# Patient Record
Sex: Female | Born: 1965 | Hispanic: No | Marital: Married | Smoking: Never smoker
Health system: Southern US, Community
[De-identification: ages and names within clinical notes are randomized; demographics above are authoritative.]

---

## 2009-08-07 DEATH — deceased

## 2010-08-07 HISTORY — PX: BREAST BIOPSY: SHX20

## 2010-11-04 ENCOUNTER — Ambulatory Visit: Payer: Self-pay | Admitting: Family Medicine

## 2011-07-12 ENCOUNTER — Ambulatory Visit: Payer: Self-pay

## 2011-07-13 ENCOUNTER — Ambulatory Visit: Payer: Self-pay

## 2012-01-25 ENCOUNTER — Ambulatory Visit: Payer: Self-pay | Admitting: General Surgery

## 2012-04-27 ENCOUNTER — Ambulatory Visit: Payer: Self-pay | Admitting: Physician Assistant

## 2012-04-27 LAB — URINALYSIS, COMPLETE
Ketone: NEGATIVE
Nitrite: NEGATIVE
Ph: 6 (ref 4.5–8.0)
Protein: 300
Specific Gravity: >=1.03 (ref 1.003–1.030)

## 2012-05-03 ENCOUNTER — Emergency Department: Payer: Self-pay | Admitting: Emergency Medicine

## 2012-05-03 LAB — CBC
HCT: 35.2 % (ref 35.0–47.0)
HGB: 11.5 g/dL — ABNORMAL LOW (ref 12.0–16.0)
MCV: 88 fL (ref 80–100)
RBC: 3.99 10*6/uL (ref 3.80–5.20)
RDW: 14.6 % — ABNORMAL HIGH (ref 11.5–14.5)
WBC: 14 10*3/uL — ABNORMAL HIGH (ref 3.6–11.0)

## 2012-05-03 LAB — COMPREHENSIVE METABOLIC PANEL
Alkaline Phosphatase: 326 U/L — ABNORMAL HIGH (ref 50–136)
Calcium, Total: 8.9 mg/dL (ref 8.5–10.1)
Co2: 26 mmol/L (ref 21–32)
Creatinine: 1.11 mg/dL (ref 0.60–1.30)
EGFR (Non-African Amer.): 59 — ABNORMAL LOW
Glucose: 114 mg/dL — ABNORMAL HIGH (ref 65–99)
SGPT (ALT): 33 U/L (ref 12–78)

## 2012-05-03 LAB — URINALYSIS, COMPLETE
Glucose,UR: NEGATIVE mg/dL (ref 0–75)
Ketone: NEGATIVE
Nitrite: NEGATIVE
Ph: 6 (ref 4.5–8.0)
Protein: NEGATIVE
Specific Gravity: 1.008 (ref 1.003–1.030)
WBC UR: 8 /HPF (ref 0–5)

## 2013-01-15 ENCOUNTER — Ambulatory Visit: Payer: Self-pay

## 2014-08-09 IMAGING — CT CT STONE STUDY
1 of 2 series · 15 of 32 positions shown, 19 images · non-contrast
Comparison: None

REASON FOR EXAM: left flank pain
COMMENTS:

PROCEDURE:     CT  - CT ABDOMEN /PELVIS WO (STONE)  - May 03, 2012  [DATE]
RESULT:     Indication: Flank Pain
TECHNIQUE: Multiple axial images from the lung bases to the symphysis pubis
were obtained without oral and without intravenous contrast.

[Series 2: 3mm soft tissue · axial · 0.78mm/px · z∈[-488,-59]mm · 15 of 157 slices shown, 19 images]
[im 7/157  soft-tissue]
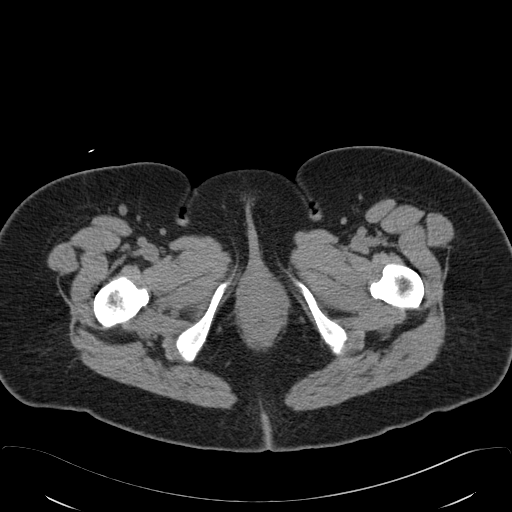
[im 7/157  bone]
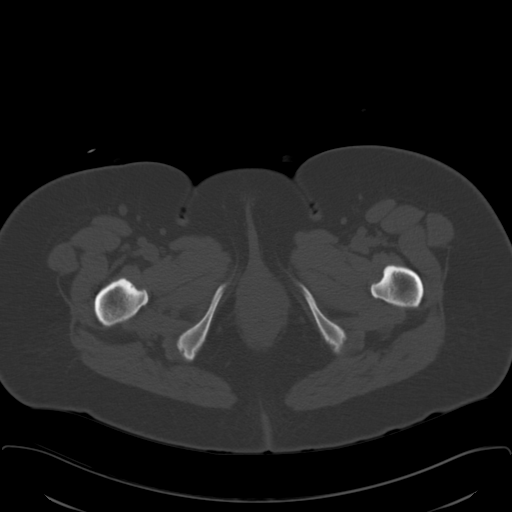
[im 21/157  soft-tissue]
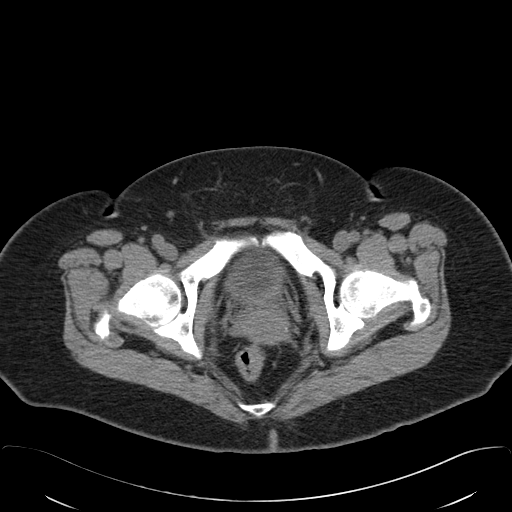
[im 34/157  soft-tissue]
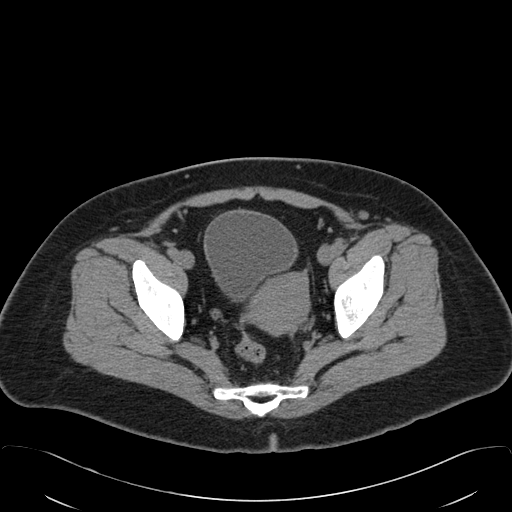
[im 41/157  soft-tissue]
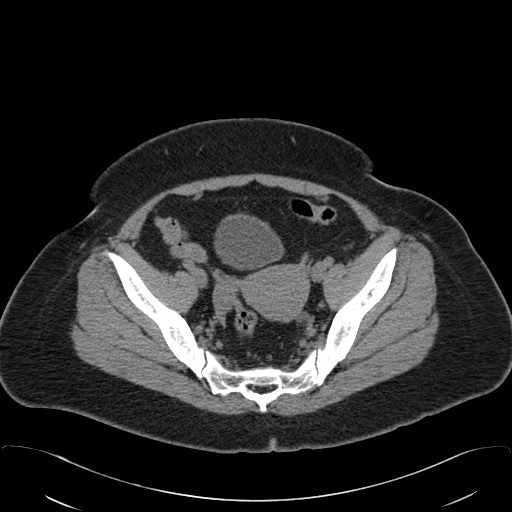
[im 55/157  soft-tissue]
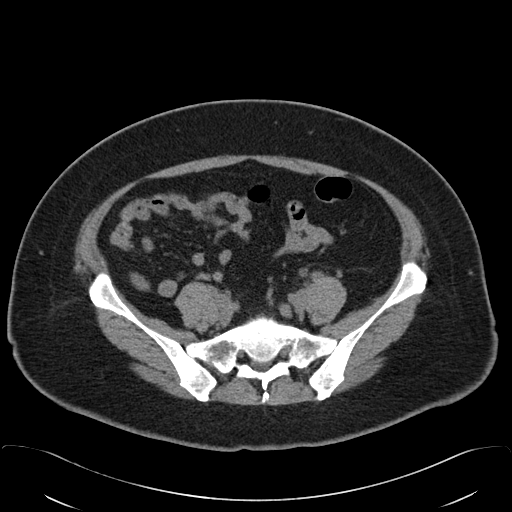
[im 68/157  soft-tissue]
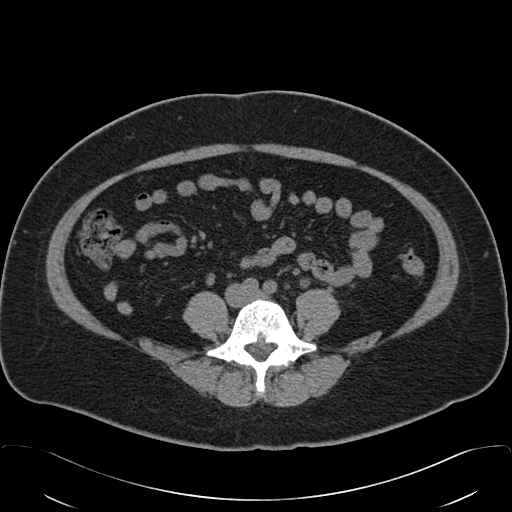
[im 82/157  soft-tissue]
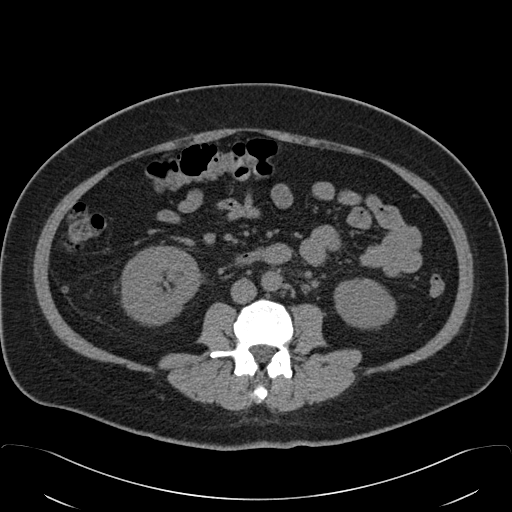
[im 89/157  soft-tissue]
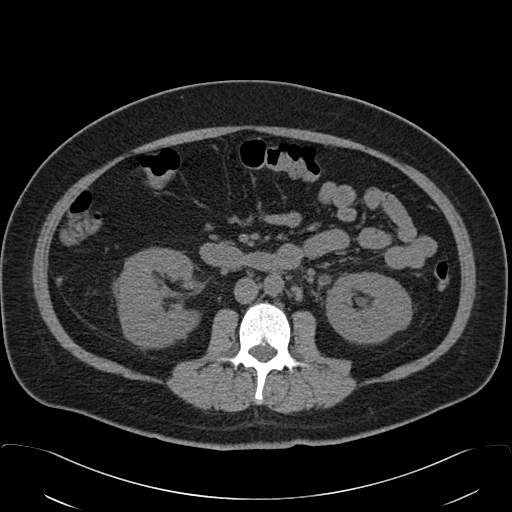
[im 102/157  soft-tissue]
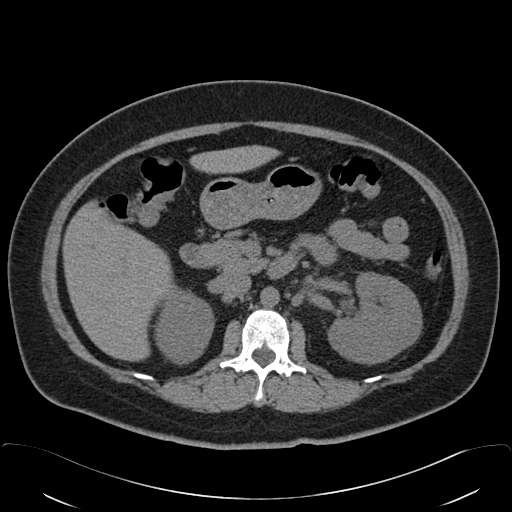
[im 102/157  bone]
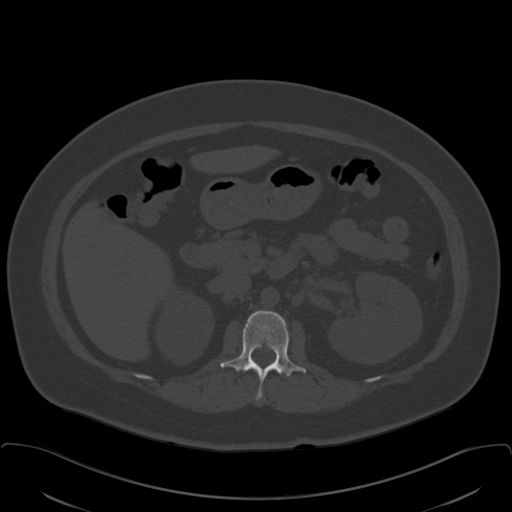
[im 116/157  soft-tissue]
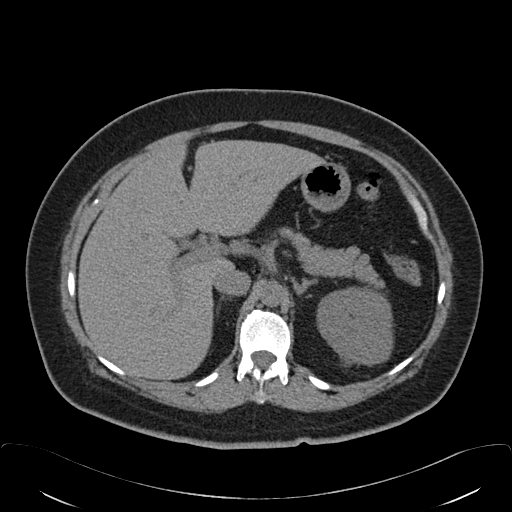
[im 123/157  soft-tissue]
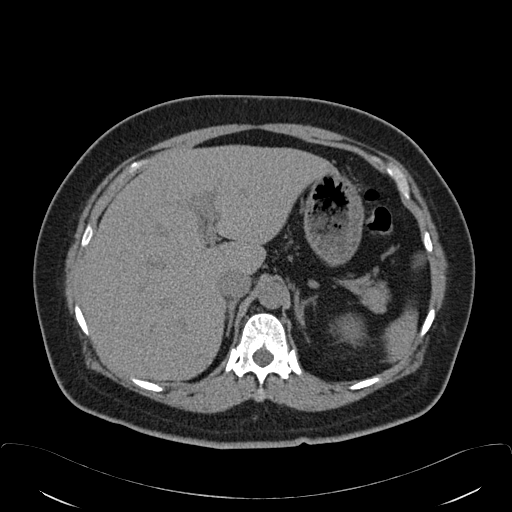
[im 129/157  lung]
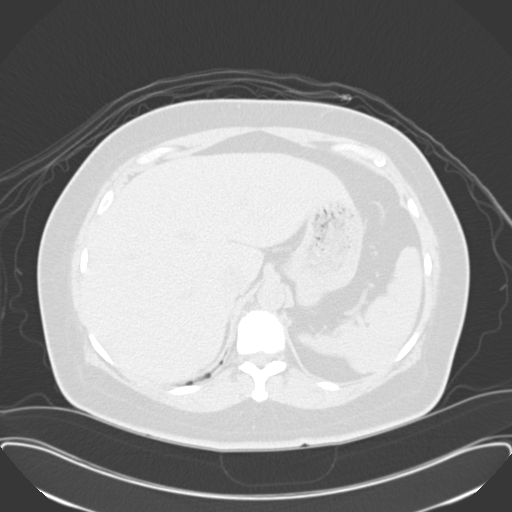
[im 136/157  soft-tissue]
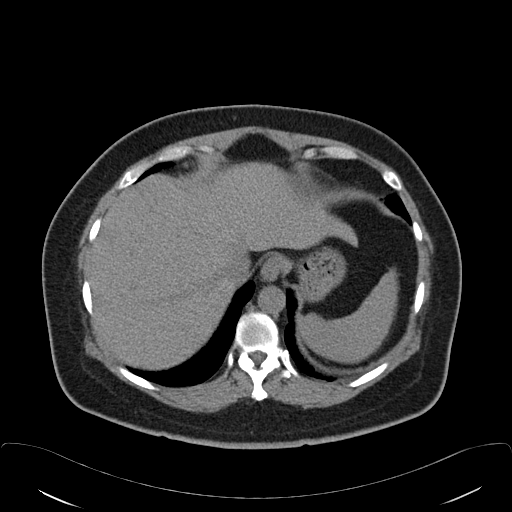
[im 136/157  lung]
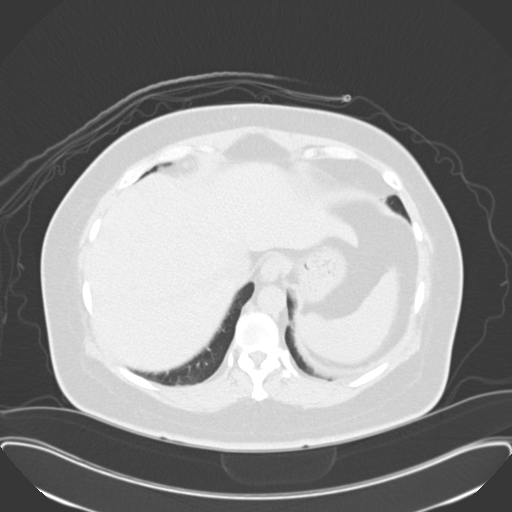
[im 143/157  lung]
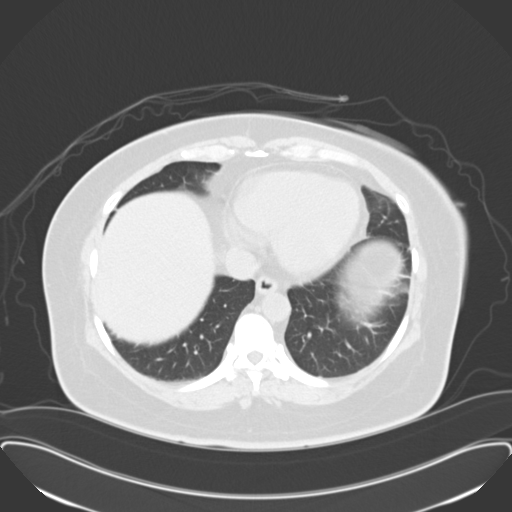
[im 150/157  soft-tissue]
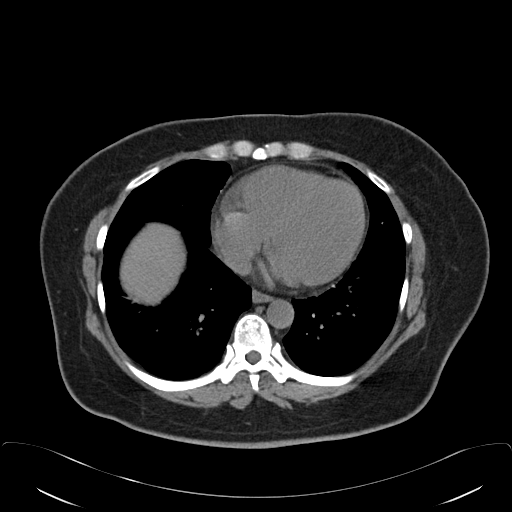
[im 150/157  lung]
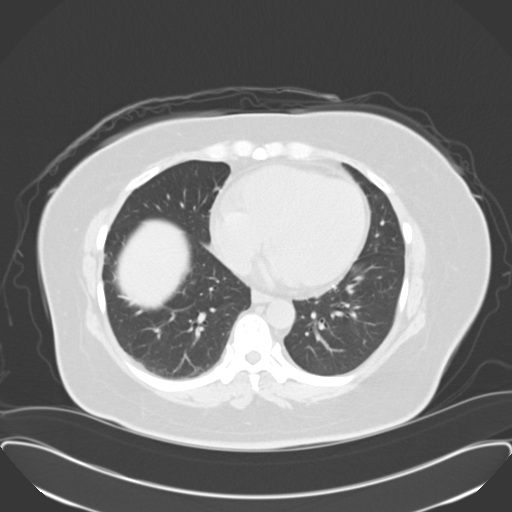

[15 of 32 positions shown; findings below may reference images not displayed]

FINDINGS: The lung bases are clear. There is no pleural or pericardial effusions.

No renal, ureteral, or bladder calculi. There is mild left
hydroureteronephrosis to the level of the left UVJ. Is may represent a
recently passed calculus versus a radiolucent calculus versus infection. The
kidneys are symmetric in size without evidence for exophytic mass. The
bladder is unremarkable.

The liver demonstrates no focal abnormality. The gallbladder is
unremarkable. The spleen demonstrates no focal abnormality. The adrenal
glands and pancreas are normal.

The unopacified stomach, duodenum, small intestine, and large intestine are
unremarkable, but evaluation is limited by lack of oral contrast.  There is
no pneumoperitoneum, pneumatosis, or portal venous gas. There is no
abdominal or pelvic free fluid. There are a few mildly enlarged
retroperitoneal lymph nodes with the largest measuring 11 mm in short axis.

The abdominal aorta is normal in caliber .

The osseous structures are unremarkable.
IMPRESSION: 1. There is mild left hydroureteronephrosis to the level of the left UVJ. Is
may represent a recently passed calculus versus a radiolucent calculus
versus infection.

[REDACTED]

## 2014-08-20 ENCOUNTER — Ambulatory Visit: Payer: Self-pay | Admitting: Obstetrics and Gynecology

## 2016-05-16 ENCOUNTER — Other Ambulatory Visit: Payer: Self-pay | Admitting: Pediatrics

## 2016-05-16 DIAGNOSIS — Z1231 Encounter for screening mammogram for malignant neoplasm of breast: Secondary | ICD-10-CM

## 2016-06-19 ENCOUNTER — Encounter: Payer: Self-pay | Admitting: Radiology

## 2016-06-19 ENCOUNTER — Ambulatory Visit
Admission: RE | Admit: 2016-06-19 | Discharge: 2016-06-19 | Disposition: A | Discharge status: Home / Self Care | Payer: BLUE CROSS/BLUE SHIELD | Source: Ambulatory Visit | Attending: Pediatrics | Admitting: Pediatrics

## 2016-06-19 DIAGNOSIS — Z1231 Encounter for screening mammogram for malignant neoplasm of breast: Secondary | ICD-10-CM | POA: Insufficient documentation

## 2016-06-23 ENCOUNTER — Other Ambulatory Visit: Payer: Self-pay | Admitting: Pediatrics

## 2016-06-23 DIAGNOSIS — R928 Other abnormal and inconclusive findings on diagnostic imaging of breast: Secondary | ICD-10-CM

## 2016-07-21 ENCOUNTER — Ambulatory Visit
Admission: RE | Admit: 2016-07-21 | Discharge: 2016-07-21 | Disposition: A | Discharge status: Home / Self Care | Payer: BLUE CROSS/BLUE SHIELD | Source: Ambulatory Visit | Attending: Pediatrics | Admitting: Pediatrics

## 2016-07-21 DIAGNOSIS — N6489 Other specified disorders of breast: Secondary | ICD-10-CM | POA: Diagnosis not present

## 2016-07-21 DIAGNOSIS — R928 Other abnormal and inconclusive findings on diagnostic imaging of breast: Secondary | ICD-10-CM

## 2016-08-01 ENCOUNTER — Other Ambulatory Visit: Payer: Self-pay | Admitting: Pediatrics

## 2016-08-01 DIAGNOSIS — R928 Other abnormal and inconclusive findings on diagnostic imaging of breast: Secondary | ICD-10-CM

## 2016-08-09 ENCOUNTER — Ambulatory Visit
Admission: RE | Admit: 2016-08-09 | Discharge: 2016-08-09 | Disposition: A | Discharge status: Home / Self Care | Payer: BLUE CROSS/BLUE SHIELD | Source: Ambulatory Visit | Attending: Pediatrics | Admitting: Pediatrics

## 2016-08-09 DIAGNOSIS — N6031 Fibrosclerosis of right breast: Secondary | ICD-10-CM | POA: Diagnosis not present

## 2016-08-09 DIAGNOSIS — R928 Other abnormal and inconclusive findings on diagnostic imaging of breast: Secondary | ICD-10-CM

## 2016-08-09 HISTORY — PX: BREAST BIOPSY: SHX20

## 2016-08-10 LAB — SURGICAL PATHOLOGY

## 2017-12-26 ENCOUNTER — Encounter: Payer: Self-pay | Admitting: Dietician

## 2017-12-26 ENCOUNTER — Encounter: Payer: BLUE CROSS/BLUE SHIELD | Attending: Family Medicine | Admitting: Dietician

## 2017-12-26 VITALS — Ht 64.0 in | Wt 188.6 lb

## 2017-12-26 DIAGNOSIS — E119 Type 2 diabetes mellitus without complications: Secondary | ICD-10-CM | POA: Diagnosis not present

## 2017-12-26 NOTE — Progress Notes (Signed)
Medical Nutrition Therapy: Visit start time: 0900  end time: 1020 Assessment:  Diagnosis: Type 2 Diabetes Past medical history: HTN, HLD, had breast surgery 2017 Psychosocial issues/ stress concerns: none Preferred learning method:  . Auditory . Visual  Current weight: 188.6lb  Height: 5'4" Medications, supplements: reconciled list in medical record.  Progress and evaluation: Patient reports practicing intermittent fasting by avoiding eating after 6pm and before 11am on most days. She reports weight of 197lbs about 1 month ago, and has lost about 9-10lbs.  Her most recent HbA1C result was 7.0% (12/04/17); she reports glucose testing at home is showing improved numbers, fasting BGs in 110s-120s now.   Physical activity: walking 60 minutes, 2-3 times a week  Dietary Intake:  Usual eating pattern includes 3 meals and 0 snacks per day eats-- over span of 8 hours. Dining out frequency: 5 meals per week [employer], [employer], [employer], [employer].  Breakfast: Saba banana (asian variety), sardines Snack: none Lunch: 3pm banana or other quick option Snack: none Supper: 5:30-6:30pm fried veggie rolls with meat -- similar to spring rolls; chicken enchiladas Snack: none Beverages: water, occasionally ginger ale  Nutrition Care Education: Topics covered: weight control, diabetes Basic nutrition: basic food groups, appropriate nutrient balance, appropriate meal and snack schedule , general nutrition guidelines    Weight control: benefits of weight control, importance of choosing low fat and low sugar foods; appropriate food portions and strategies for portion control; basic meal planning for 1200kcal as minimum daily intake to meet nutritional needs; role of physical activity Diabetes:  appropriate meal and snack schedule, appropriate carb intake and balance, prevention of and treatment for low BGs and importance of preventing low BG with long fasting periods.   Nutritional Diagnosis:  [place]liver-2.2  Altered nutrition-related laboratory As related to diabetes.  As evidenced by HbA1C of 7.0%. [place]-3.3 Overweight/obesity As related to history of excess calories and inactivity.  As evidenced by patient with BMI of 32 and patient's reported diet history.  Intervention: Instruction as noted above.   Patient has been making significant changes to improve BGs as well as lose weight.    Advised MD supervision of intermittent fasting diet with increased risk for hypoglycemia.   Set goals with direction from patient.  Education Materials given:  . General diet guidelines for Diabetes . Plate Planner with food lists . Goals/ instructions  Learner/ who was taught:  . Patient   Level of understanding: Marland Kitchen Verbalizes/ demonstrates competency  Demonstrated degree of understanding via:   Teach back Learning barriers: . None  Willingness to learn/ readiness for change: . Eager, change in progress  Monitoring and Evaluation:  Dietary intake, exercise, BG control, and body weight      follow up: 02/01/18

## 2017-12-26 NOTE — Patient Instructions (Signed)
   Make sure meals have a balance of carbs, protein, and vegetables. At least eat some protein and some carb with every meal.   Continue to eat 3 meals every day within the typical 8-hour time span.   Keep some healthy options for snacks such as fruit with 1oz cheese or 1-2Tablespoons of peanut butter; raw veggies with hummus dip; 1 cup of yogurt (if plain, add some fruit); crackers with cheese or peanut butter.   Keep up the regular exercise, great job!  If blood sugar drops too low (under 70), then drink about 4oz of ginger ale which should bring sugar back to normal within 10-15 minutes. Follow it with a meal or snack within 30 minutes to avoid another low blood sugar.

## 2018-01-10 ENCOUNTER — Ambulatory Visit: Payer: BLUE CROSS/BLUE SHIELD | Admitting: Dietician

## 2018-02-01 ENCOUNTER — Ambulatory Visit: Payer: BLUE CROSS/BLUE SHIELD | Admitting: Dietician

## 2018-03-01 ENCOUNTER — Encounter: Payer: Self-pay | Admitting: Dietician

## 2018-03-01 NOTE — Progress Notes (Signed)
Have not heard back from patient to reschedule her missed appointment from 01/31/18. Sent letter to referring provider. 

## 2018-05-06 IMAGING — US US BREAST*R* LIMITED INC AXILLA
1 series · 13 of 14 positions shown · non-contrast
Comparison: Previous exam(s).

CLINICAL DATA: Screening recall for a possible architectural
distortion in the upper outer right breast.

EXAM:
2D DIGITAL DIAGNOSTIC RIGHT MAMMOGRAM WITH CAD AND ADJUNCT TOMO
ULTRASOUND RIGHT BREAST

[Series 1: us breast*right* limited inc axilla · 0.06mm/px · 13 of 14 slices shown]
[im 1/14]
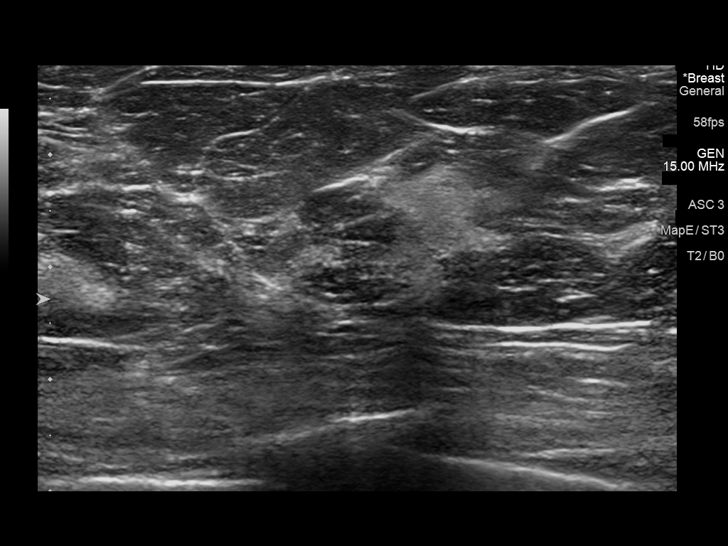
[im 2/14]
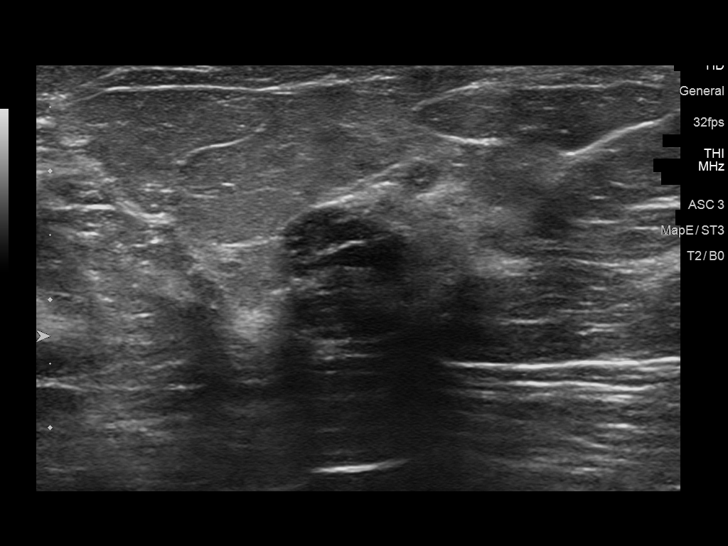
[im 3/14]
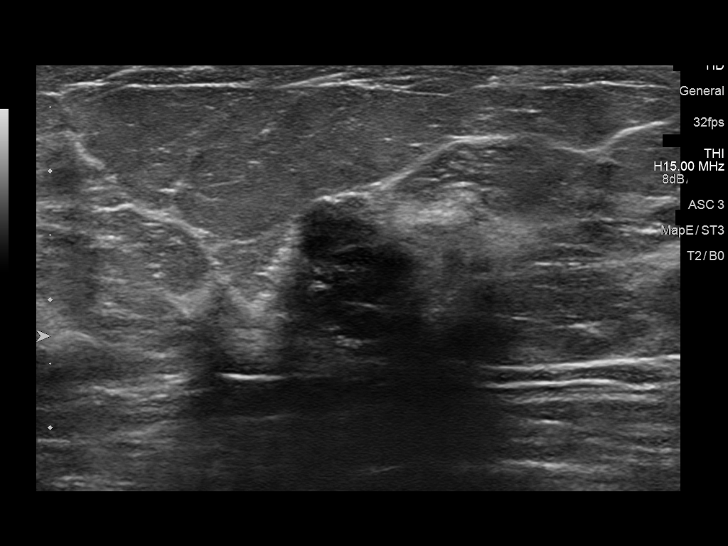
[im 4/14]
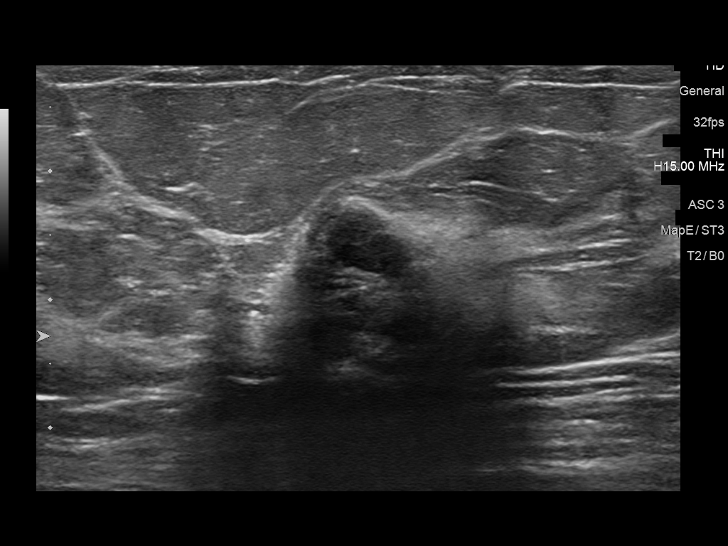
[im 5/14]
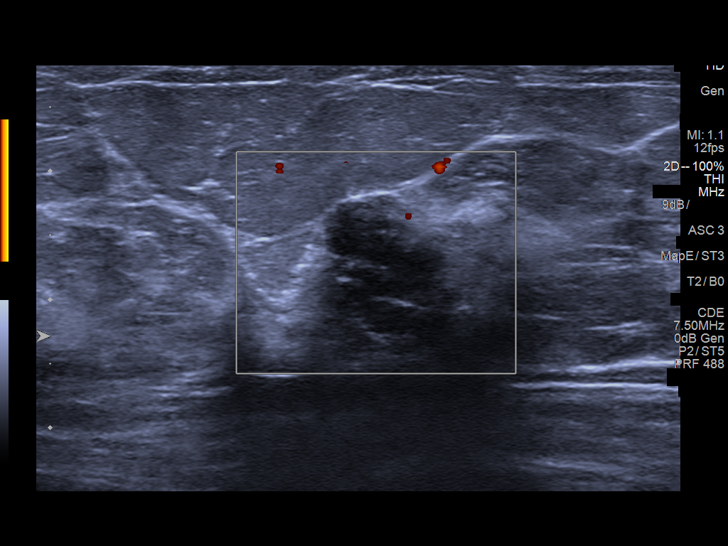
[im 6/14]
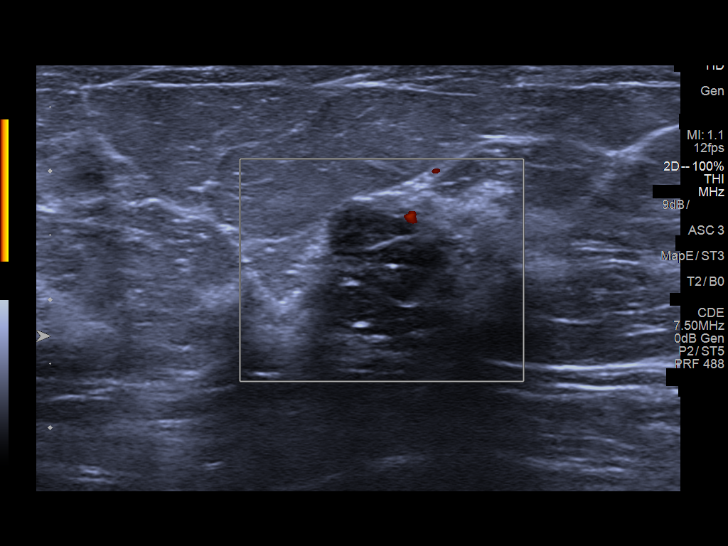
[im 8/14]
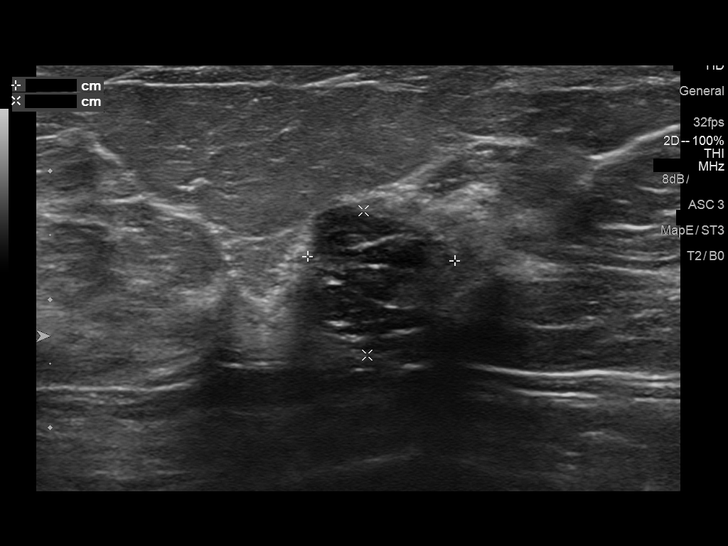
[im 9/14]
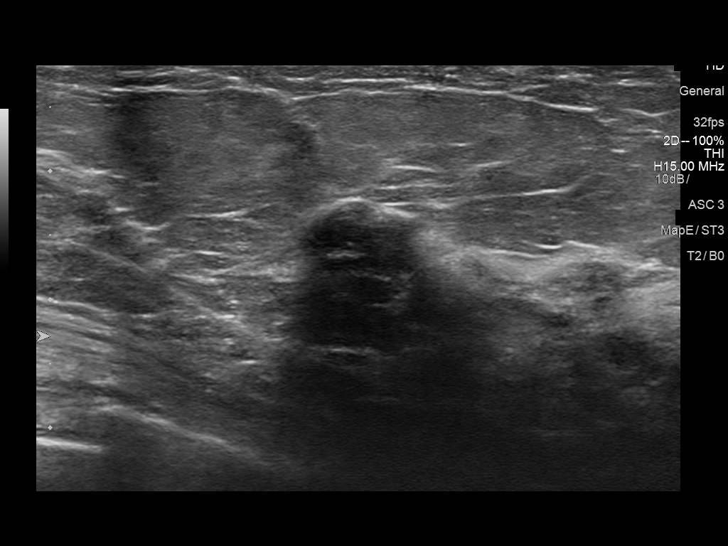
[im 10/14]
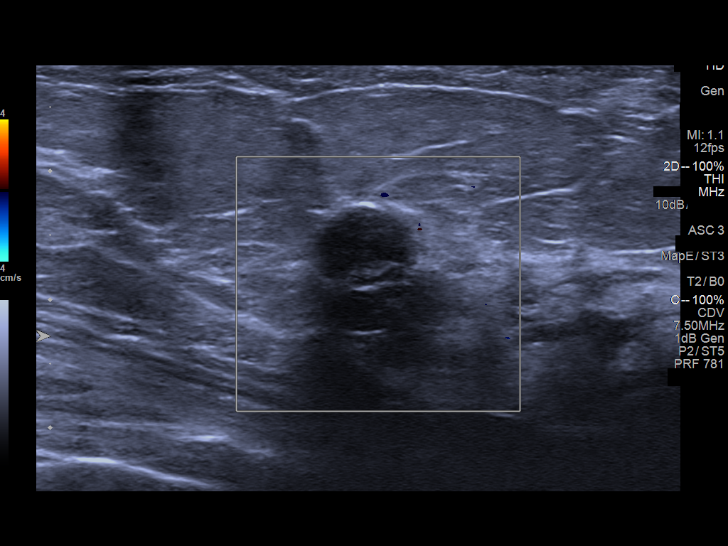
[im 11/14]
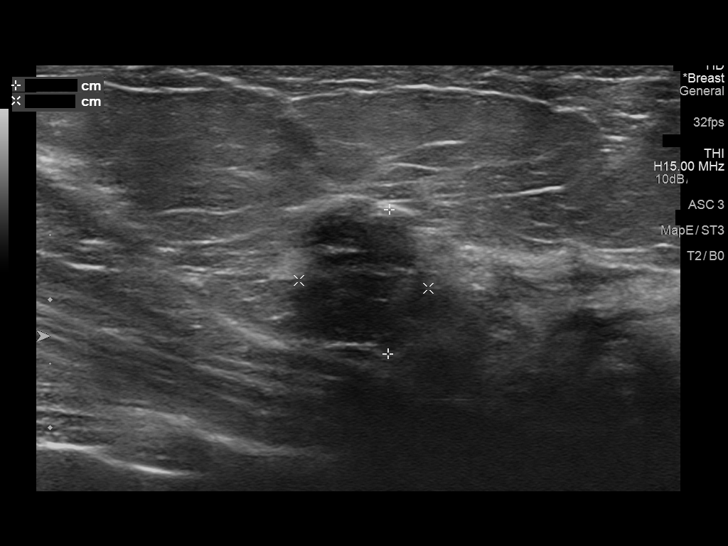
[im 12/14]
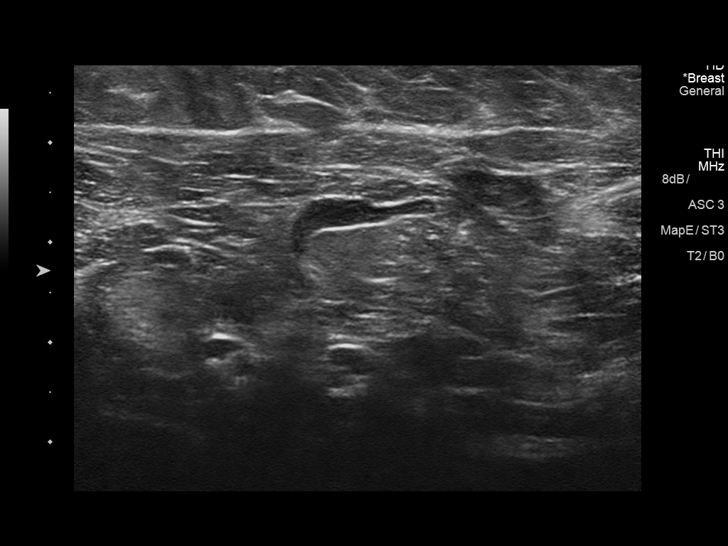
[im 13/14]
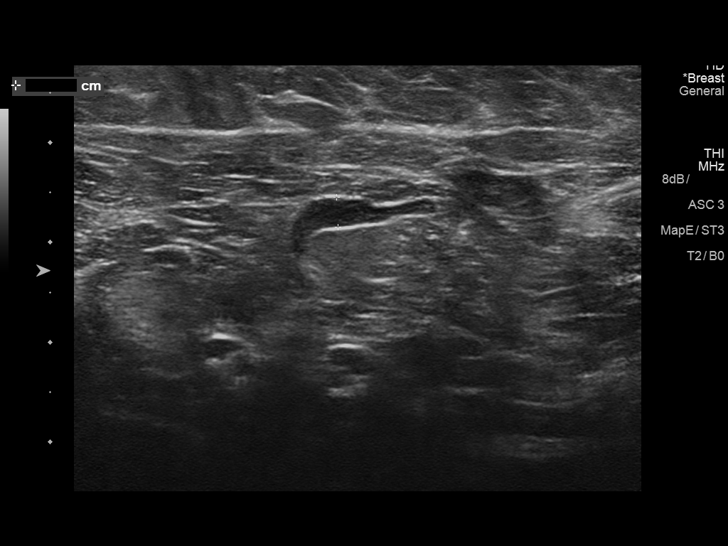
[im 14/14]
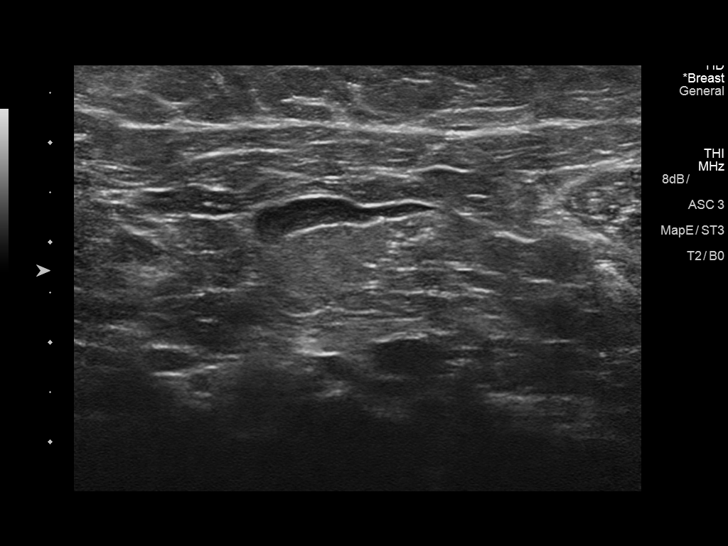

[13 of 14 positions shown; findings below may reference images not displayed]

ACR Breast Density Category c: The breast tissue is heterogeneously
dense, which may obscure small masses.
FINDINGS: The architectural distortion persists on the spot-compression
diagnostic 2D and 3D images, particularly on the CC view. There may
be a small underlying mass.

Mammographic images were processed with CAD.

On physical exam, no mass is palpated in the upper outer right
breast.

Targeted ultrasound is performed, showing several hypoechoic areas
are noted, largest in the right breast at 11 o'clock, 4 cm the
nipple, measuring 12 x 11 x 11 mm. No convincing sonographic
architectural distortion. No enlarged or abnormal right axillary
lymph nodes.
IMPRESSION: 1. Right breast upper outer quadrant architectural distortion,
suspicious for breast carcinoma. The ultrasound findings are less
suspicious and not clearly related to the architectural distortion.

RECOMMENDATION:
Tissue sampling is recommended. Since the architectural distortion
is the most suspicious finding, Affirm stereotactic biopsy is
recommended. Patient's referring clinician's office be contacted
regarding this recommendation.

I have discussed the findings and recommendations with the patient.
Results were also provided in writing at the conclusion of the
visit. If applicable, a reminder letter will be sent to the patient
regarding the next appointment.

BI-RADS CATEGORY  4: Suspicious.

## 2022-12-01 ENCOUNTER — Other Ambulatory Visit: Payer: Self-pay | Admitting: Family Medicine

## 2022-12-01 DIAGNOSIS — E119 Type 2 diabetes mellitus without complications: Secondary | ICD-10-CM

## 2022-12-01 DIAGNOSIS — R1013 Epigastric pain: Secondary | ICD-10-CM

## 2022-12-05 ENCOUNTER — Ambulatory Visit: Payer: 59 | Attending: Family Medicine
